# Patient Record
Sex: Female | Born: 1995 | Race: White | Hispanic: No | Marital: Single | Smoking: Never smoker
Health system: Southern US, Community
[De-identification: ages and names within clinical notes are randomized; demographics above are authoritative.]

---
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
## 2016-01-14 DIAGNOSIS — Z3041 Encounter for surveillance of contraceptive pills: Secondary | ICD-10-CM | POA: Diagnosis not present

## 2016-01-15 DIAGNOSIS — M359 Systemic involvement of connective tissue, unspecified: Secondary | ICD-10-CM | POA: Diagnosis not present

## 2016-01-17 DIAGNOSIS — J4540 Moderate persistent asthma, uncomplicated: Secondary | ICD-10-CM | POA: Diagnosis not present

## 2016-01-17 DIAGNOSIS — H1045 Other chronic allergic conjunctivitis: Secondary | ICD-10-CM | POA: Diagnosis not present

## 2016-01-17 DIAGNOSIS — J309 Allergic rhinitis, unspecified: Secondary | ICD-10-CM | POA: Diagnosis not present

## 2016-01-21 DIAGNOSIS — G4700 Insomnia, unspecified: Secondary | ICD-10-CM | POA: Diagnosis not present

## 2016-01-21 DIAGNOSIS — F411 Generalized anxiety disorder: Secondary | ICD-10-CM | POA: Diagnosis not present

## 2016-01-21 DIAGNOSIS — F340 Cyclothymic disorder: Secondary | ICD-10-CM | POA: Diagnosis not present

## 2016-01-21 DIAGNOSIS — F4010 Social phobia, unspecified: Secondary | ICD-10-CM | POA: Diagnosis not present

## 2016-01-28 ENCOUNTER — Other Ambulatory Visit: Payer: Self-pay | Admitting: Allergy & Immunology

## 2016-02-20 DIAGNOSIS — R631 Polydipsia: Secondary | ICD-10-CM | POA: Diagnosis not present

## 2016-05-27 DIAGNOSIS — F4010 Social phobia, unspecified: Secondary | ICD-10-CM | POA: Diagnosis not present

## 2016-05-27 DIAGNOSIS — G4700 Insomnia, unspecified: Secondary | ICD-10-CM | POA: Diagnosis not present

## 2016-05-27 DIAGNOSIS — F411 Generalized anxiety disorder: Secondary | ICD-10-CM | POA: Diagnosis not present

## 2016-05-27 DIAGNOSIS — Z62820 Parent-biological child conflict: Secondary | ICD-10-CM | POA: Diagnosis not present

## 2016-05-29 DIAGNOSIS — J309 Allergic rhinitis, unspecified: Secondary | ICD-10-CM | POA: Diagnosis not present

## 2016-05-29 DIAGNOSIS — J4540 Moderate persistent asthma, uncomplicated: Secondary | ICD-10-CM | POA: Diagnosis not present

## 2016-05-29 DIAGNOSIS — H1045 Other chronic allergic conjunctivitis: Secondary | ICD-10-CM | POA: Diagnosis not present

## 2016-06-09 DIAGNOSIS — R5381 Other malaise: Secondary | ICD-10-CM | POA: Diagnosis not present

## 2016-06-09 DIAGNOSIS — N926 Irregular menstruation, unspecified: Secondary | ICD-10-CM | POA: Diagnosis not present

## 2016-06-09 DIAGNOSIS — R5383 Other fatigue: Secondary | ICD-10-CM | POA: Diagnosis not present

## 2016-06-09 DIAGNOSIS — N946 Dysmenorrhea, unspecified: Secondary | ICD-10-CM | POA: Diagnosis not present

## 2016-06-22 DIAGNOSIS — F419 Anxiety disorder, unspecified: Secondary | ICD-10-CM | POA: Diagnosis not present

## 2016-06-23 DIAGNOSIS — F329 Major depressive disorder, single episode, unspecified: Secondary | ICD-10-CM | POA: Diagnosis not present

## 2016-06-23 DIAGNOSIS — F4010 Social phobia, unspecified: Secondary | ICD-10-CM | POA: Diagnosis not present

## 2016-06-23 DIAGNOSIS — Z62820 Parent-biological child conflict: Secondary | ICD-10-CM | POA: Diagnosis not present

## 2016-06-23 DIAGNOSIS — F411 Generalized anxiety disorder: Secondary | ICD-10-CM | POA: Diagnosis not present

## 2016-06-29 DIAGNOSIS — F419 Anxiety disorder, unspecified: Secondary | ICD-10-CM | POA: Diagnosis not present

## 2016-06-30 DIAGNOSIS — Z0000 Encounter for general adult medical examination without abnormal findings: Secondary | ICD-10-CM | POA: Diagnosis not present

## 2016-07-02 DIAGNOSIS — F422 Mixed obsessional thoughts and acts: Secondary | ICD-10-CM | POA: Diagnosis not present

## 2016-07-02 DIAGNOSIS — Z1389 Encounter for screening for other disorder: Secondary | ICD-10-CM | POA: Diagnosis not present

## 2016-07-02 DIAGNOSIS — M359 Systemic involvement of connective tissue, unspecified: Secondary | ICD-10-CM | POA: Diagnosis not present

## 2016-07-02 DIAGNOSIS — R634 Abnormal weight loss: Secondary | ICD-10-CM | POA: Diagnosis not present

## 2016-07-02 DIAGNOSIS — F308 Other manic episodes: Secondary | ICD-10-CM | POA: Diagnosis not present

## 2016-07-02 DIAGNOSIS — J45909 Unspecified asthma, uncomplicated: Secondary | ICD-10-CM | POA: Diagnosis not present

## 2016-07-02 DIAGNOSIS — F4010 Social phobia, unspecified: Secondary | ICD-10-CM | POA: Diagnosis not present

## 2016-07-02 DIAGNOSIS — Z0000 Encounter for general adult medical examination without abnormal findings: Secondary | ICD-10-CM | POA: Diagnosis not present

## 2016-07-02 DIAGNOSIS — F411 Generalized anxiety disorder: Secondary | ICD-10-CM | POA: Diagnosis not present

## 2016-07-06 DIAGNOSIS — F419 Anxiety disorder, unspecified: Secondary | ICD-10-CM | POA: Diagnosis not present

## 2016-07-13 DIAGNOSIS — F419 Anxiety disorder, unspecified: Secondary | ICD-10-CM | POA: Diagnosis not present

## 2016-07-14 DIAGNOSIS — Z713 Dietary counseling and surveillance: Secondary | ICD-10-CM | POA: Diagnosis not present

## 2016-07-20 DIAGNOSIS — F419 Anxiety disorder, unspecified: Secondary | ICD-10-CM | POA: Diagnosis not present

## 2016-07-21 DIAGNOSIS — Z713 Dietary counseling and surveillance: Secondary | ICD-10-CM | POA: Diagnosis not present

## 2016-07-28 DIAGNOSIS — Z713 Dietary counseling and surveillance: Secondary | ICD-10-CM | POA: Diagnosis not present

## 2016-07-30 DIAGNOSIS — F340 Cyclothymic disorder: Secondary | ICD-10-CM | POA: Diagnosis not present

## 2016-07-30 DIAGNOSIS — F4010 Social phobia, unspecified: Secondary | ICD-10-CM | POA: Diagnosis not present

## 2016-07-30 DIAGNOSIS — F411 Generalized anxiety disorder: Secondary | ICD-10-CM | POA: Diagnosis not present

## 2016-07-30 DIAGNOSIS — F308 Other manic episodes: Secondary | ICD-10-CM | POA: Diagnosis not present

## 2016-08-04 DIAGNOSIS — Z713 Dietary counseling and surveillance: Secondary | ICD-10-CM | POA: Diagnosis not present

## 2016-08-05 DIAGNOSIS — F419 Anxiety disorder, unspecified: Secondary | ICD-10-CM | POA: Diagnosis not present

## 2016-08-07 DIAGNOSIS — N946 Dysmenorrhea, unspecified: Secondary | ICD-10-CM | POA: Diagnosis not present

## 2016-08-07 DIAGNOSIS — Z8349 Family history of other endocrine, nutritional and metabolic diseases: Secondary | ICD-10-CM | POA: Diagnosis not present

## 2016-08-07 DIAGNOSIS — N926 Irregular menstruation, unspecified: Secondary | ICD-10-CM | POA: Diagnosis not present

## 2016-08-07 DIAGNOSIS — N83202 Unspecified ovarian cyst, left side: Secondary | ICD-10-CM | POA: Diagnosis not present

## 2016-08-11 DIAGNOSIS — Z713 Dietary counseling and surveillance: Secondary | ICD-10-CM | POA: Diagnosis not present

## 2016-08-12 DIAGNOSIS — F419 Anxiety disorder, unspecified: Secondary | ICD-10-CM | POA: Diagnosis not present

## 2016-08-25 DIAGNOSIS — Z713 Dietary counseling and surveillance: Secondary | ICD-10-CM | POA: Diagnosis not present

## 2016-08-26 DIAGNOSIS — F419 Anxiety disorder, unspecified: Secondary | ICD-10-CM | POA: Diagnosis not present

## 2016-09-25 DIAGNOSIS — F330 Major depressive disorder, recurrent, mild: Secondary | ICD-10-CM | POA: Diagnosis not present

## 2016-10-06 DIAGNOSIS — F330 Major depressive disorder, recurrent, mild: Secondary | ICD-10-CM | POA: Diagnosis not present

## 2016-10-15 DIAGNOSIS — F330 Major depressive disorder, recurrent, mild: Secondary | ICD-10-CM | POA: Diagnosis not present

## 2016-10-28 DIAGNOSIS — F419 Anxiety disorder, unspecified: Secondary | ICD-10-CM | POA: Diagnosis not present

## 2016-11-03 DIAGNOSIS — F330 Major depressive disorder, recurrent, mild: Secondary | ICD-10-CM | POA: Diagnosis not present

## 2016-11-09 DIAGNOSIS — F330 Major depressive disorder, recurrent, mild: Secondary | ICD-10-CM | POA: Diagnosis not present

## 2016-11-26 DIAGNOSIS — F330 Major depressive disorder, recurrent, mild: Secondary | ICD-10-CM | POA: Diagnosis not present

## 2016-12-28 DIAGNOSIS — F419 Anxiety disorder, unspecified: Secondary | ICD-10-CM | POA: Diagnosis not present

## 2016-12-29 DIAGNOSIS — F5000 Anorexia nervosa, unspecified: Secondary | ICD-10-CM | POA: Diagnosis not present

## 2016-12-30 DIAGNOSIS — F422 Mixed obsessional thoughts and acts: Secondary | ICD-10-CM | POA: Diagnosis not present

## 2016-12-30 DIAGNOSIS — F4010 Social phobia, unspecified: Secondary | ICD-10-CM | POA: Diagnosis not present

## 2016-12-30 DIAGNOSIS — F411 Generalized anxiety disorder: Secondary | ICD-10-CM | POA: Diagnosis not present

## 2016-12-30 DIAGNOSIS — G4700 Insomnia, unspecified: Secondary | ICD-10-CM | POA: Diagnosis not present

## 2017-01-01 DIAGNOSIS — F330 Major depressive disorder, recurrent, mild: Secondary | ICD-10-CM | POA: Diagnosis not present

## 2017-01-13 DIAGNOSIS — H1045 Other chronic allergic conjunctivitis: Secondary | ICD-10-CM | POA: Diagnosis not present

## 2017-01-13 DIAGNOSIS — J4540 Moderate persistent asthma, uncomplicated: Secondary | ICD-10-CM | POA: Diagnosis not present

## 2017-01-13 DIAGNOSIS — J309 Allergic rhinitis, unspecified: Secondary | ICD-10-CM | POA: Diagnosis not present

## 2017-01-18 DIAGNOSIS — F419 Anxiety disorder, unspecified: Secondary | ICD-10-CM | POA: Diagnosis not present

## 2017-01-19 DIAGNOSIS — F5000 Anorexia nervosa, unspecified: Secondary | ICD-10-CM | POA: Diagnosis not present

## 2017-01-19 DIAGNOSIS — N83202 Unspecified ovarian cyst, left side: Secondary | ICD-10-CM | POA: Diagnosis not present

## 2017-01-21 DIAGNOSIS — M359 Systemic involvement of connective tissue, unspecified: Secondary | ICD-10-CM | POA: Diagnosis not present

## 2017-01-27 DIAGNOSIS — Z713 Dietary counseling and surveillance: Secondary | ICD-10-CM | POA: Diagnosis not present

## 2017-01-28 DIAGNOSIS — F330 Major depressive disorder, recurrent, mild: Secondary | ICD-10-CM | POA: Diagnosis not present

## 2017-02-10 DIAGNOSIS — Z713 Dietary counseling and surveillance: Secondary | ICD-10-CM | POA: Diagnosis not present

## 2017-02-22 DIAGNOSIS — F330 Major depressive disorder, recurrent, mild: Secondary | ICD-10-CM | POA: Diagnosis not present

## 2017-02-23 DIAGNOSIS — Z713 Dietary counseling and surveillance: Secondary | ICD-10-CM | POA: Diagnosis not present

## 2017-03-08 DIAGNOSIS — F330 Major depressive disorder, recurrent, mild: Secondary | ICD-10-CM | POA: Diagnosis not present

## 2017-03-10 DIAGNOSIS — Z713 Dietary counseling and surveillance: Secondary | ICD-10-CM | POA: Diagnosis not present

## 2017-03-24 DIAGNOSIS — Z713 Dietary counseling and surveillance: Secondary | ICD-10-CM | POA: Diagnosis not present

## 2017-03-25 DIAGNOSIS — F330 Major depressive disorder, recurrent, mild: Secondary | ICD-10-CM | POA: Diagnosis not present

## 2017-04-05 DIAGNOSIS — F330 Major depressive disorder, recurrent, mild: Secondary | ICD-10-CM | POA: Diagnosis not present

## 2017-04-19 DIAGNOSIS — F330 Major depressive disorder, recurrent, mild: Secondary | ICD-10-CM | POA: Diagnosis not present

## 2017-05-03 DIAGNOSIS — F330 Major depressive disorder, recurrent, mild: Secondary | ICD-10-CM | POA: Diagnosis not present

## 2017-05-17 DIAGNOSIS — F330 Major depressive disorder, recurrent, mild: Secondary | ICD-10-CM | POA: Diagnosis not present

## 2017-06-23 DIAGNOSIS — N83292 Other ovarian cyst, left side: Secondary | ICD-10-CM | POA: Diagnosis not present

## 2017-06-23 DIAGNOSIS — N945 Secondary dysmenorrhea: Secondary | ICD-10-CM | POA: Diagnosis not present

## 2017-06-23 DIAGNOSIS — N920 Excessive and frequent menstruation with regular cycle: Secondary | ICD-10-CM | POA: Diagnosis not present

## 2017-06-24 DIAGNOSIS — N944 Primary dysmenorrhea: Secondary | ICD-10-CM | POA: Diagnosis not present

## 2017-07-06 DIAGNOSIS — F330 Major depressive disorder, recurrent, mild: Secondary | ICD-10-CM | POA: Diagnosis not present

## 2017-07-23 DIAGNOSIS — F330 Major depressive disorder, recurrent, mild: Secondary | ICD-10-CM | POA: Diagnosis not present

## 2017-08-09 DIAGNOSIS — F330 Major depressive disorder, recurrent, mild: Secondary | ICD-10-CM | POA: Diagnosis not present

## 2017-08-31 DIAGNOSIS — L709 Acne, unspecified: Secondary | ICD-10-CM | POA: Diagnosis not present

## 2017-09-03 DIAGNOSIS — N944 Primary dysmenorrhea: Secondary | ICD-10-CM | POA: Diagnosis not present

## 2017-09-03 DIAGNOSIS — Z793 Long term (current) use of hormonal contraceptives: Secondary | ICD-10-CM | POA: Diagnosis not present

## 2017-09-03 DIAGNOSIS — N920 Excessive and frequent menstruation with regular cycle: Secondary | ICD-10-CM | POA: Diagnosis not present

## 2017-09-17 DIAGNOSIS — F330 Major depressive disorder, recurrent, mild: Secondary | ICD-10-CM | POA: Diagnosis not present

## 2017-09-24 DIAGNOSIS — J4530 Mild persistent asthma, uncomplicated: Secondary | ICD-10-CM | POA: Diagnosis not present

## 2017-09-24 DIAGNOSIS — L503 Dermatographic urticaria: Secondary | ICD-10-CM | POA: Diagnosis not present

## 2017-10-01 DIAGNOSIS — F330 Major depressive disorder, recurrent, mild: Secondary | ICD-10-CM | POA: Diagnosis not present

## 2017-10-15 DIAGNOSIS — F330 Major depressive disorder, recurrent, mild: Secondary | ICD-10-CM | POA: Diagnosis not present

## 2017-11-04 DIAGNOSIS — F330 Major depressive disorder, recurrent, mild: Secondary | ICD-10-CM | POA: Diagnosis not present

## 2017-11-26 DIAGNOSIS — F330 Major depressive disorder, recurrent, mild: Secondary | ICD-10-CM | POA: Diagnosis not present

## 2017-12-17 DIAGNOSIS — F330 Major depressive disorder, recurrent, mild: Secondary | ICD-10-CM | POA: Diagnosis not present

## 2017-12-27 DIAGNOSIS — N945 Secondary dysmenorrhea: Secondary | ICD-10-CM | POA: Diagnosis not present

## 2017-12-27 DIAGNOSIS — Z793 Long term (current) use of hormonal contraceptives: Secondary | ICD-10-CM | POA: Diagnosis not present

## 2018-01-28 DIAGNOSIS — F330 Major depressive disorder, recurrent, mild: Secondary | ICD-10-CM | POA: Diagnosis not present

## 2018-02-25 DIAGNOSIS — F330 Major depressive disorder, recurrent, mild: Secondary | ICD-10-CM | POA: Diagnosis not present

## 2018-03-11 DIAGNOSIS — F330 Major depressive disorder, recurrent, mild: Secondary | ICD-10-CM | POA: Diagnosis not present

## 2018-04-06 DIAGNOSIS — F330 Major depressive disorder, recurrent, mild: Secondary | ICD-10-CM | POA: Diagnosis not present

## 2018-04-27 DIAGNOSIS — F330 Major depressive disorder, recurrent, mild: Secondary | ICD-10-CM | POA: Diagnosis not present

## 2018-05-11 DIAGNOSIS — F330 Major depressive disorder, recurrent, mild: Secondary | ICD-10-CM | POA: Diagnosis not present

## 2018-05-18 DIAGNOSIS — F330 Major depressive disorder, recurrent, mild: Secondary | ICD-10-CM | POA: Diagnosis not present

## 2018-05-26 DIAGNOSIS — F330 Major depressive disorder, recurrent, mild: Secondary | ICD-10-CM | POA: Diagnosis not present

## 2018-05-27 DIAGNOSIS — J4530 Mild persistent asthma, uncomplicated: Secondary | ICD-10-CM | POA: Diagnosis not present

## 2018-05-27 DIAGNOSIS — L503 Dermatographic urticaria: Secondary | ICD-10-CM | POA: Diagnosis not present

## 2018-06-09 DIAGNOSIS — F330 Major depressive disorder, recurrent, mild: Secondary | ICD-10-CM | POA: Diagnosis not present

## 2018-06-27 DIAGNOSIS — F330 Major depressive disorder, recurrent, mild: Secondary | ICD-10-CM | POA: Diagnosis not present

## 2018-07-20 DIAGNOSIS — F330 Major depressive disorder, recurrent, mild: Secondary | ICD-10-CM | POA: Diagnosis not present

## 2018-08-03 DIAGNOSIS — F330 Major depressive disorder, recurrent, mild: Secondary | ICD-10-CM | POA: Diagnosis not present

## 2018-08-10 DIAGNOSIS — F330 Major depressive disorder, recurrent, mild: Secondary | ICD-10-CM | POA: Diagnosis not present

## 2018-09-12 DIAGNOSIS — F330 Major depressive disorder, recurrent, mild: Secondary | ICD-10-CM | POA: Diagnosis not present

## 2018-09-23 DIAGNOSIS — F330 Major depressive disorder, recurrent, mild: Secondary | ICD-10-CM | POA: Diagnosis not present

## 2018-10-07 DIAGNOSIS — F330 Major depressive disorder, recurrent, mild: Secondary | ICD-10-CM | POA: Diagnosis not present

## 2018-10-11 DIAGNOSIS — J4530 Mild persistent asthma, uncomplicated: Secondary | ICD-10-CM | POA: Diagnosis not present

## 2018-10-11 DIAGNOSIS — L503 Dermatographic urticaria: Secondary | ICD-10-CM | POA: Diagnosis not present

## 2018-10-17 DIAGNOSIS — Z01419 Encounter for gynecological examination (general) (routine) without abnormal findings: Secondary | ICD-10-CM | POA: Diagnosis not present

## 2018-10-17 DIAGNOSIS — N921 Excessive and frequent menstruation with irregular cycle: Secondary | ICD-10-CM | POA: Diagnosis not present

## 2018-10-19 DIAGNOSIS — Z23 Encounter for immunization: Secondary | ICD-10-CM | POA: Diagnosis not present

## 2018-10-21 DIAGNOSIS — F330 Major depressive disorder, recurrent, mild: Secondary | ICD-10-CM | POA: Diagnosis not present

## 2018-11-04 DIAGNOSIS — F330 Major depressive disorder, recurrent, mild: Secondary | ICD-10-CM | POA: Diagnosis not present

## 2018-12-02 DIAGNOSIS — F330 Major depressive disorder, recurrent, mild: Secondary | ICD-10-CM | POA: Diagnosis not present

## 2018-12-13 DIAGNOSIS — J328 Other chronic sinusitis: Secondary | ICD-10-CM | POA: Diagnosis not present

## 2018-12-13 DIAGNOSIS — J383 Other diseases of vocal cords: Secondary | ICD-10-CM | POA: Diagnosis not present

## 2018-12-13 DIAGNOSIS — J4530 Mild persistent asthma, uncomplicated: Secondary | ICD-10-CM | POA: Diagnosis not present

## 2018-12-13 DIAGNOSIS — K219 Gastro-esophageal reflux disease without esophagitis: Secondary | ICD-10-CM | POA: Diagnosis not present

## 2018-12-16 DIAGNOSIS — F330 Major depressive disorder, recurrent, mild: Secondary | ICD-10-CM | POA: Diagnosis not present

## 2018-12-22 DIAGNOSIS — Z23 Encounter for immunization: Secondary | ICD-10-CM | POA: Diagnosis not present
